# Patient Record
Sex: Male | Born: 1965 | Hispanic: No | Marital: Single | Smoking: Never smoker
Health system: Southern US, Community
[De-identification: ages and names within clinical notes are randomized; demographics above are authoritative.]

---

## 2008-09-14 ENCOUNTER — Emergency Department (HOSPITAL_COMMUNITY): Admission: EM | Admit: 2008-09-14 | Discharge: 2008-09-14 | Payer: Self-pay | Admitting: Emergency Medicine

## 2008-10-08 ENCOUNTER — Encounter: Admission: RE | Admit: 2008-10-08 | Discharge: 2008-10-08 | Payer: Self-pay | Admitting: Family Medicine

## 2010-02-22 IMAGING — CR DG FINGER THUMB 2+V*R*
4 series · 4 of 4 positions shown · non-contrast
Comparison: None

CLINICAL DATA: Pain and swelling

DG RIGHT FINGER THUMB 2+ VIEW

[x finger pa right (1 of 2)]
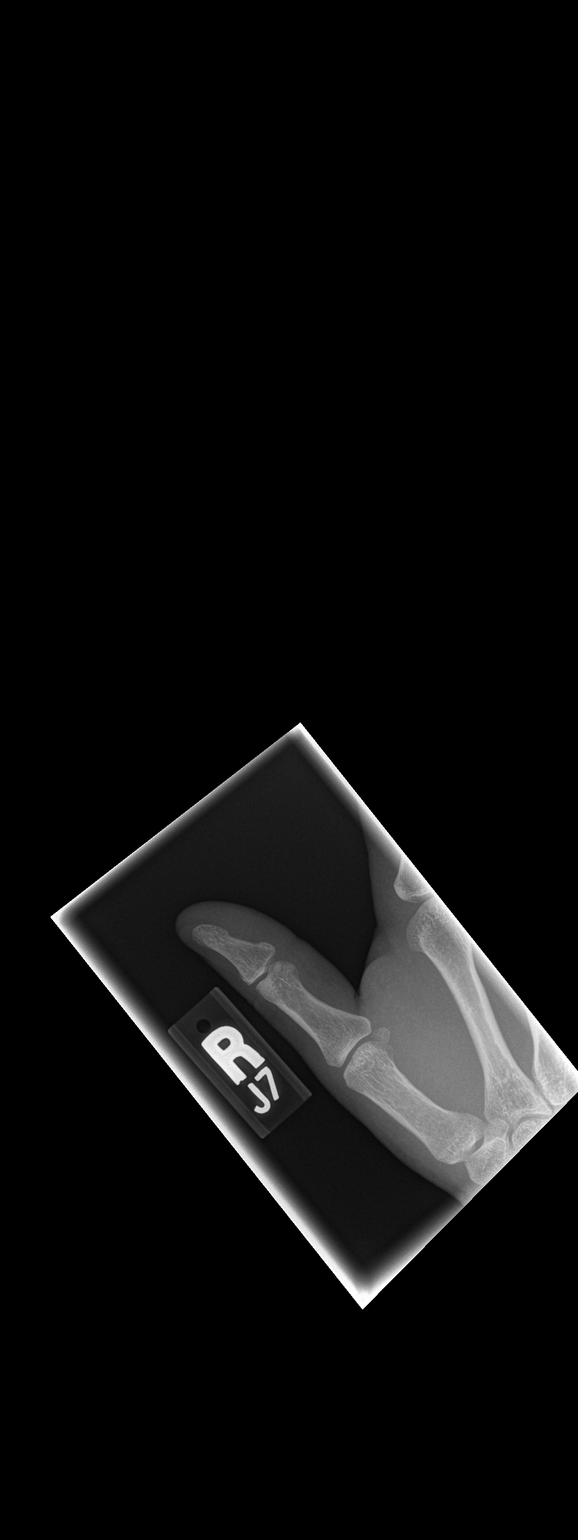

[x finger obl. right]
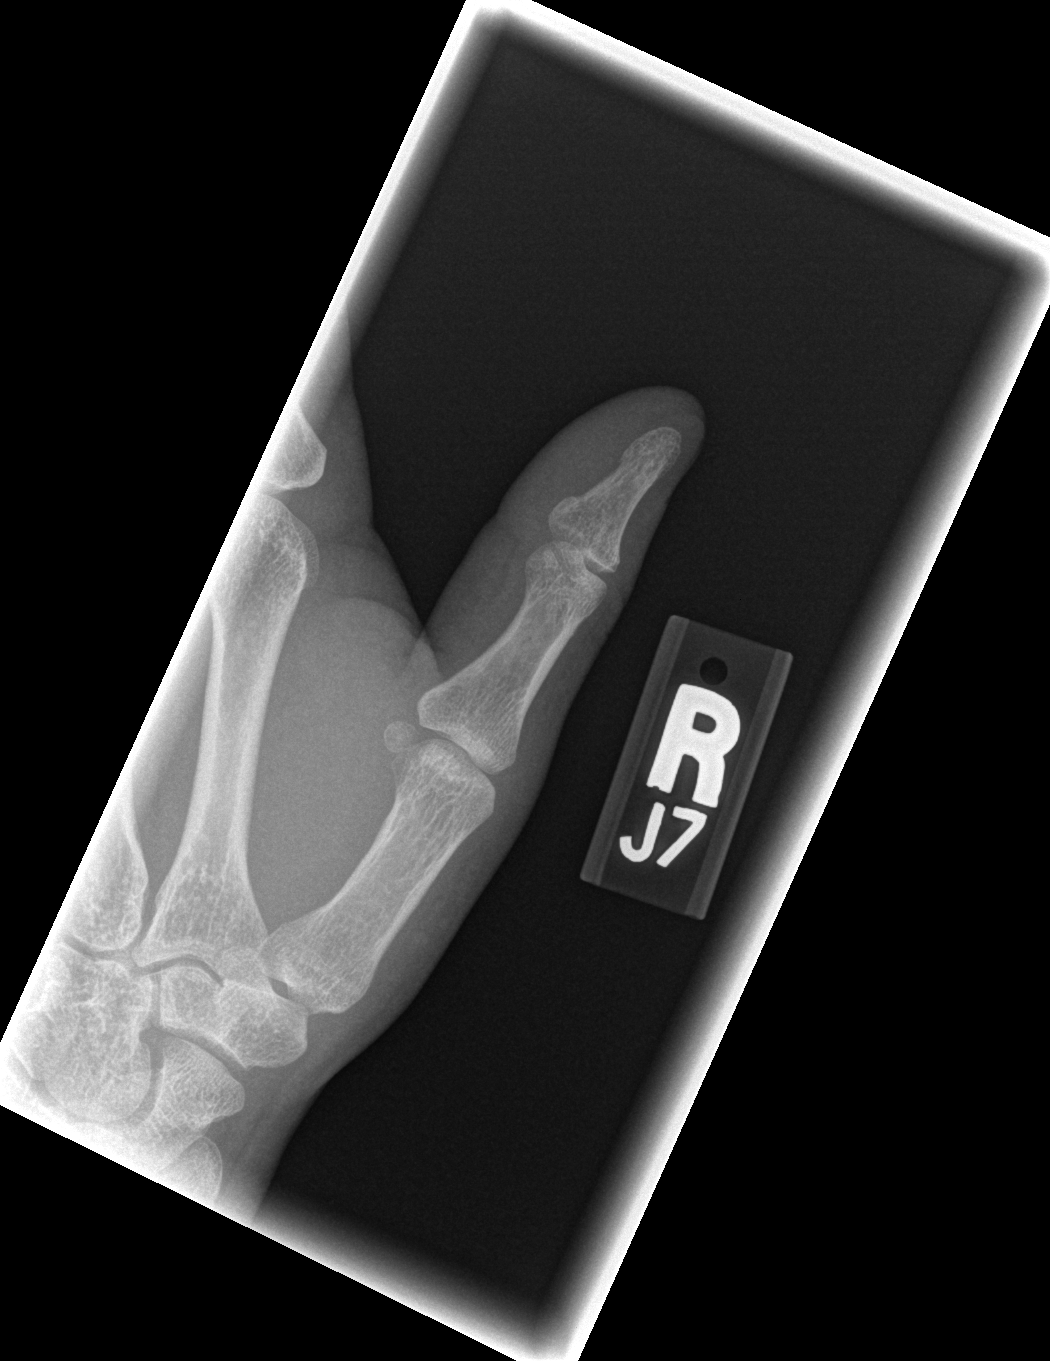

[x finger lateral right]
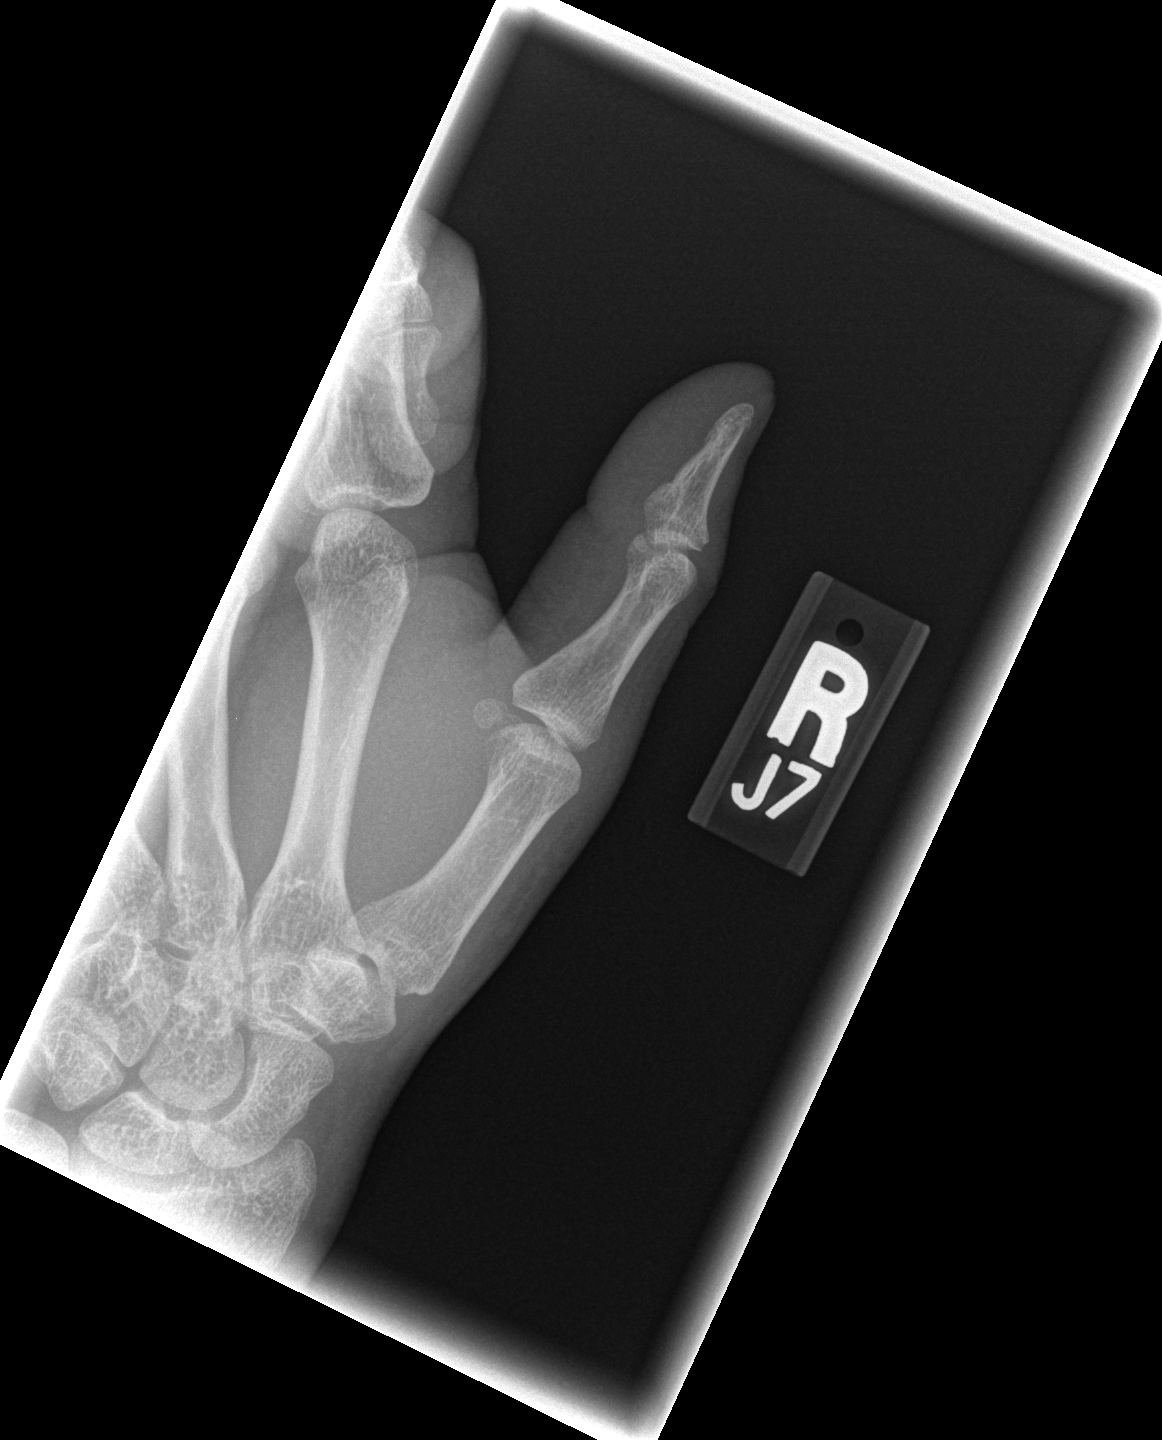

[x finger pa right (2 of 2)]
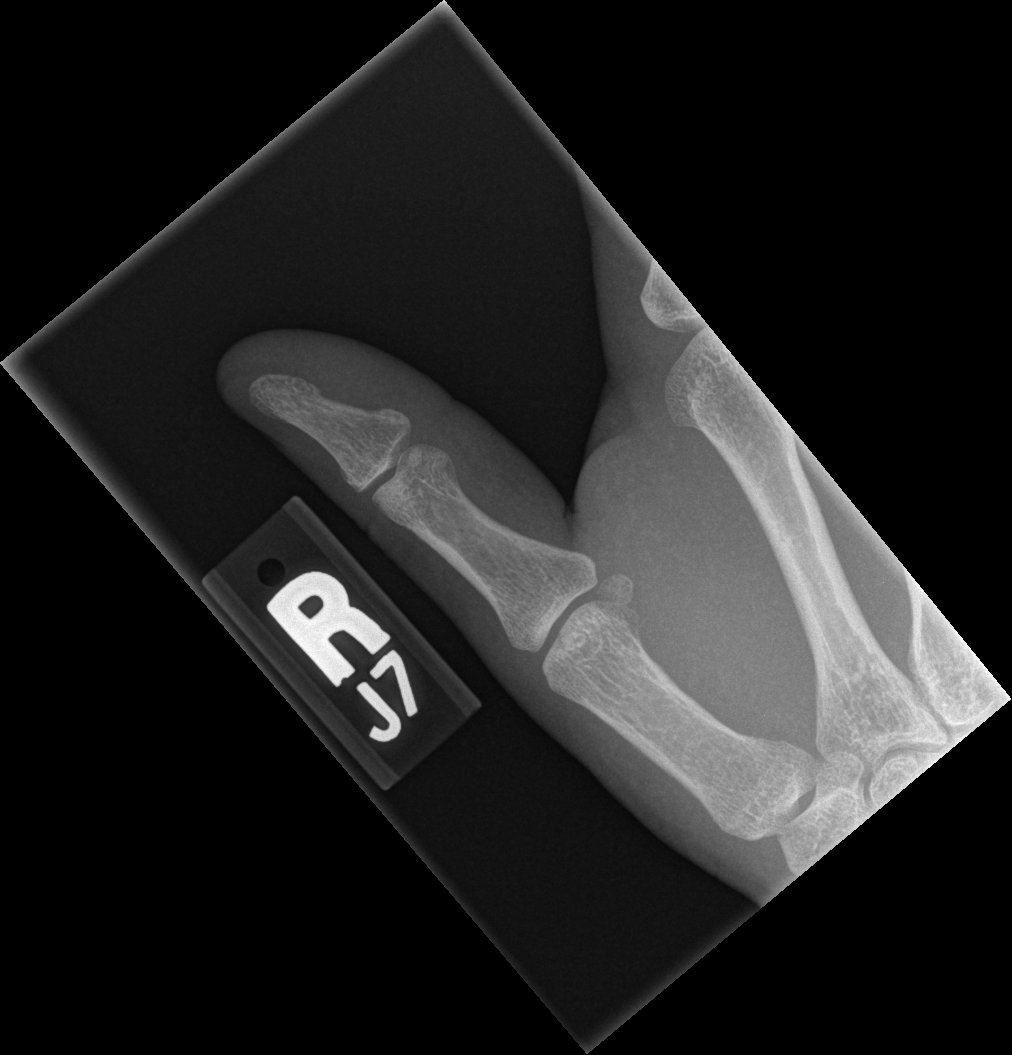

[4 of 4 positions shown; findings below may reference images not displayed]

FINDINGS: No evidence of fracture, dislocation, arthritis or
radiopaque foreign object.
IMPRESSION: Negative radiographs

## 2010-03-18 IMAGING — US US ABDOMEN COMPLETE
1 series · 14 of 25 positions shown · non-contrast
Comparison: None

CLINICAL DATA: Right lower quadrant pain

ABDOMEN ULTRASOUND
TECHNIQUE: Complete abdominal ultrasound examination was performed
including evaluation of the liver, gallbladder, bile ducts,
pancreas, kidneys, spleen, IVC, and abdominal aorta.

[Series 1: us abdomen complete · 0.37mm/px · 14 of 66 slices shown]
[im 1/66]
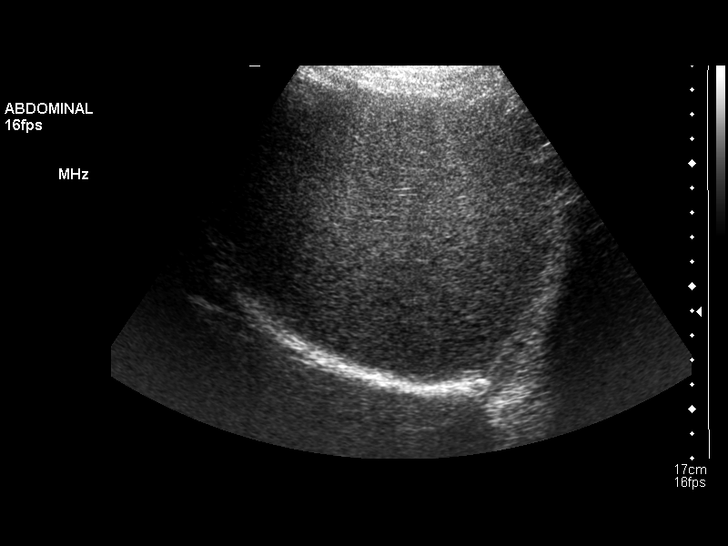
[im 6/66]
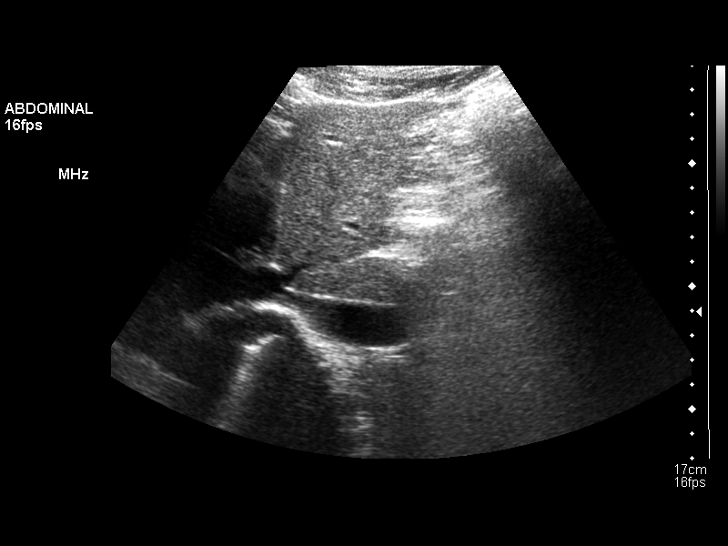
[im 11/66]
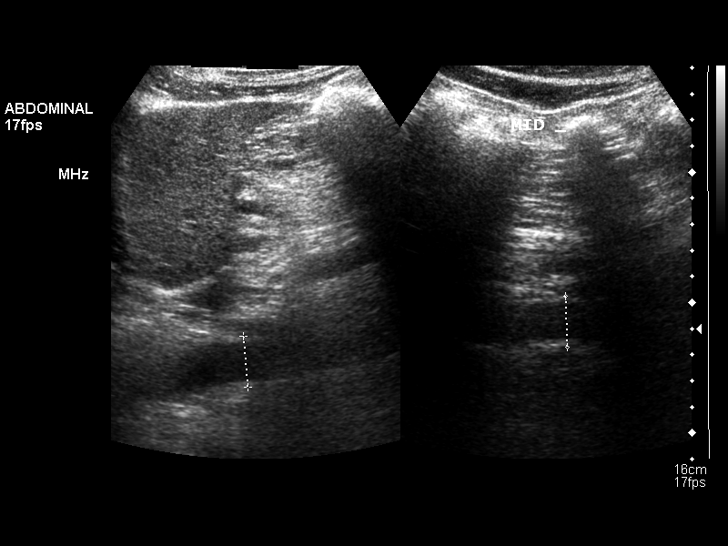
[im 17/66]
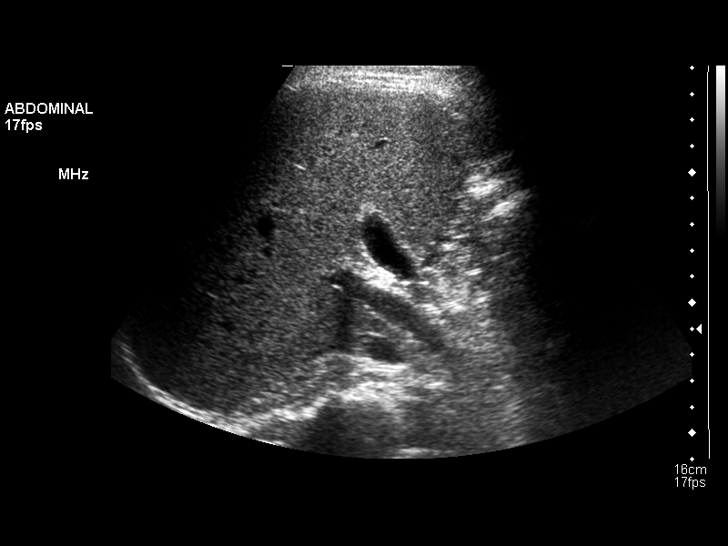
[im 22/66]
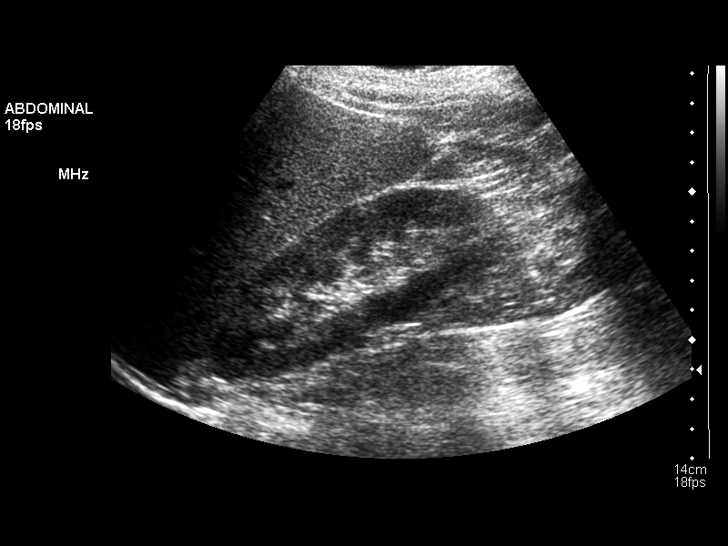
[im 25/66]
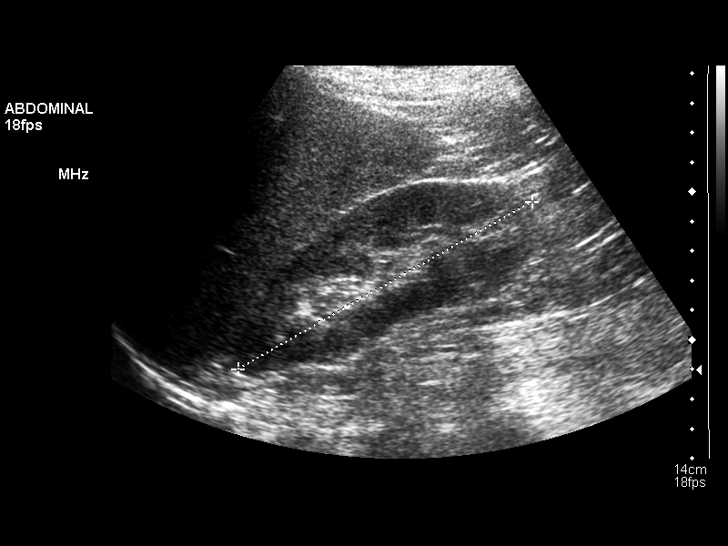
[im 30/66]
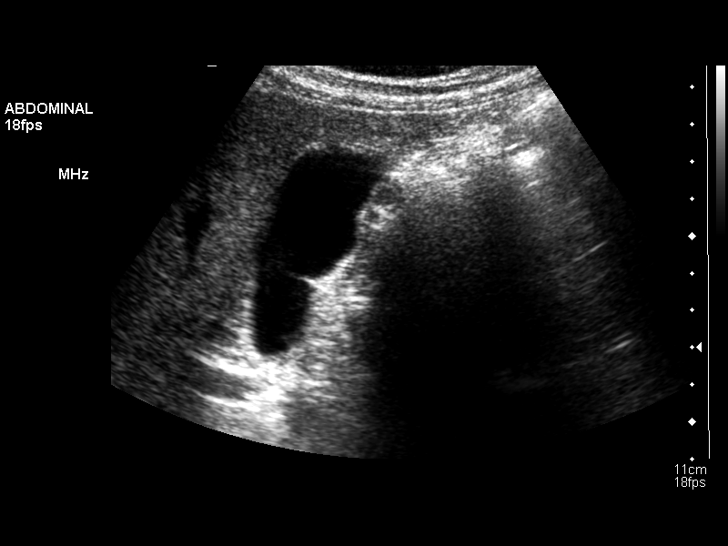
[im 36/66]
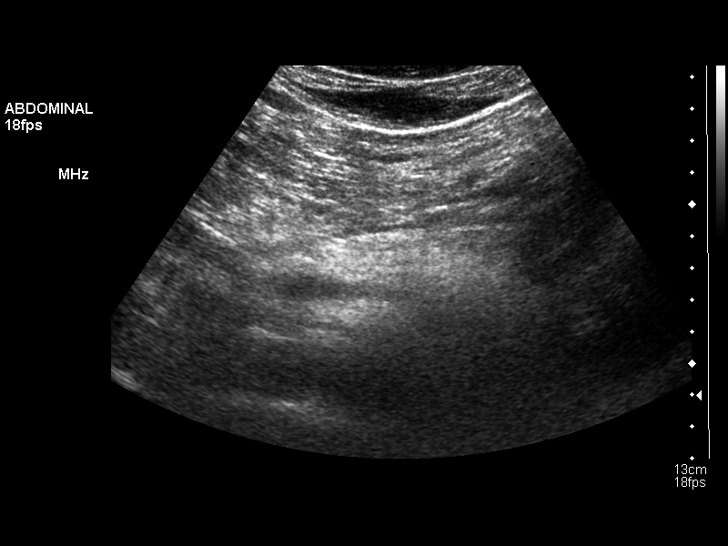
[im 41/66]
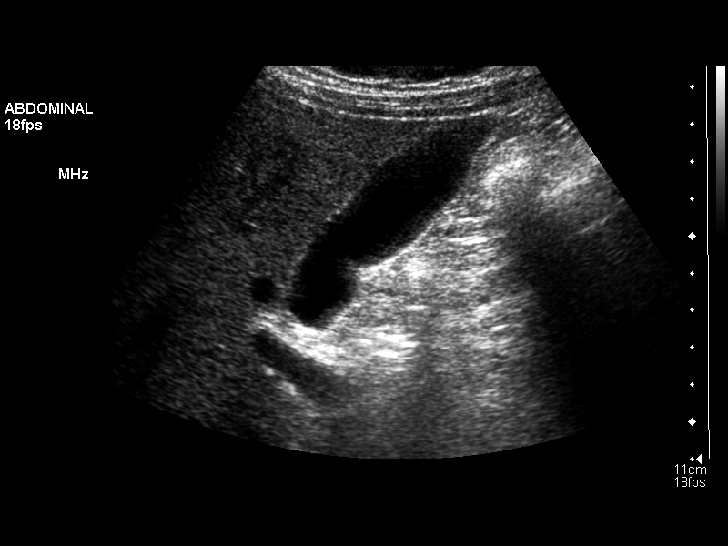
[im 44/66]
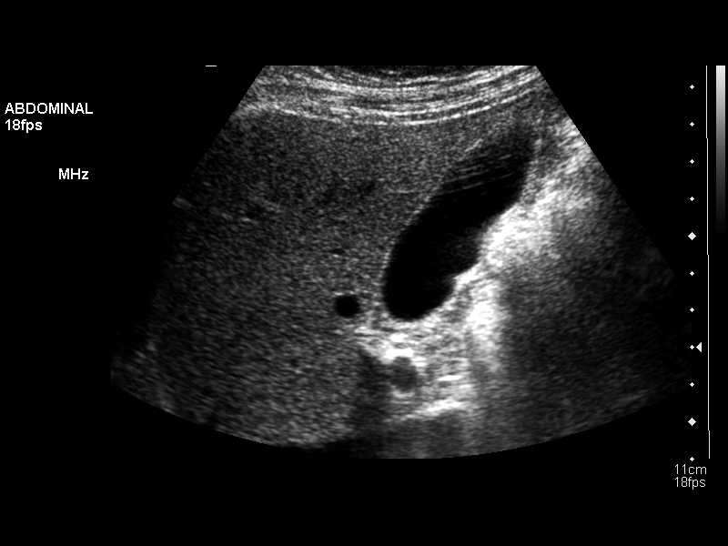
[im 49/66]
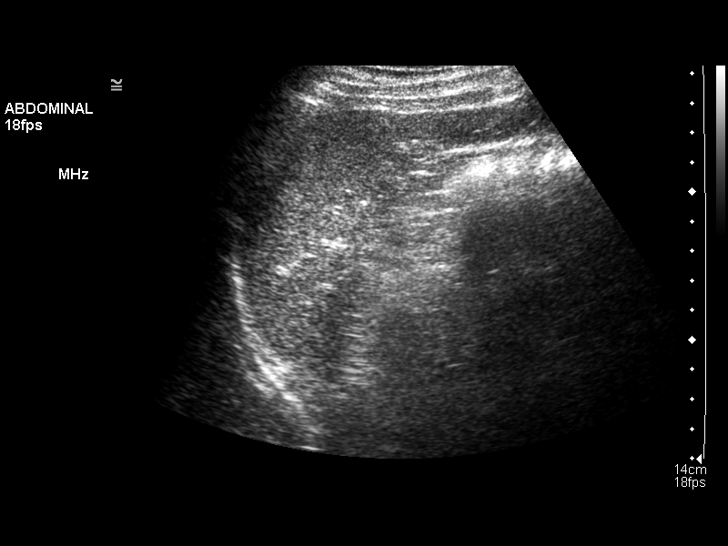
[im 55/66]
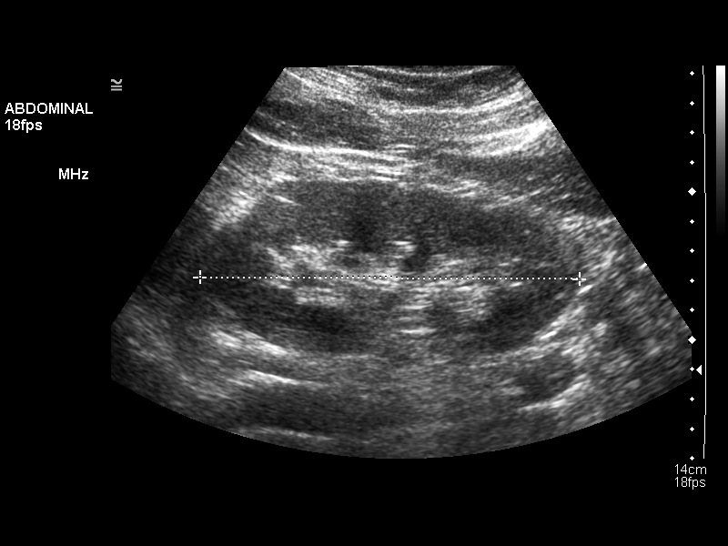
[im 60/66]
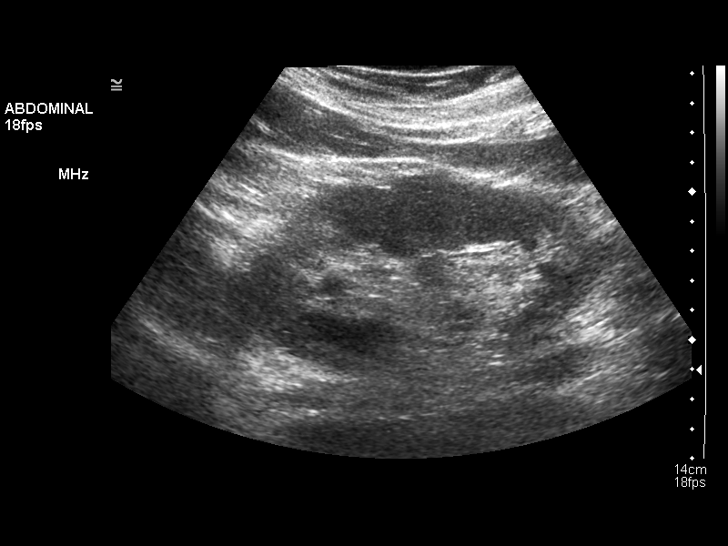
[im 66/66]
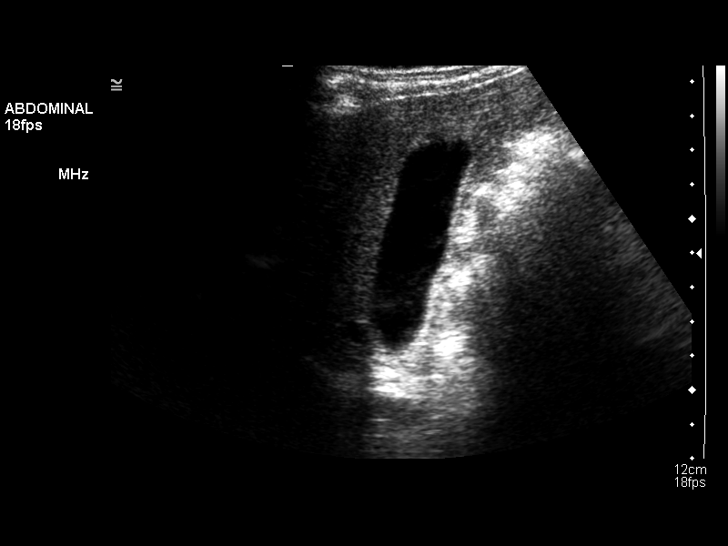

[14 of 25 positions shown; findings below may reference images not displayed]

FINDINGS: Normal gallbladder.  Gallbladder wall thickness is 2 mm.
No biliary ductal dilatation.  Common duct measures 1.7 mm in
diameter.  No hepatic, splenic, pancreatic, or renal abnormality.
Spleen measures 3.9 cm in length.  The right and left kidneys
measure 11.6 cm and 12.8 cm in length, respectively.  Patent IVC.
Maximal diameter abdominal aorta is 2.0 cm.
IMPRESSION: Negative abdominal ultrasound.

REF:G3 DICTATED: 10/08/2008 [DATE]

## 2017-07-11 ENCOUNTER — Ambulatory Visit (INDEPENDENT_AMBULATORY_CARE_PROVIDER_SITE_OTHER): Payer: Self-pay | Admitting: Physician Assistant

## 2017-07-11 ENCOUNTER — Encounter: Payer: Self-pay | Admitting: Physician Assistant

## 2017-07-11 VITALS — BP 120/78 | HR 67 | Temp 97.9°F | Resp 18 | Ht 65.0 in | Wt 159.2 lb

## 2017-07-11 DIAGNOSIS — Z024 Encounter for examination for driving license: Secondary | ICD-10-CM

## 2017-07-11 NOTE — Progress Notes (Signed)
[facility] [address], [place] [id number] [phone]  Date:  07/11/2017   Name:  Guy Wise   DOB:  04/21/1966   MRN:  [id number]  PCP:  Patient, No Pcp Per    History of Present Illness:  Guy Wise is a 51 y.o. male patient who presents to PCP with  Chief Complaint  Patient presents with  . Annual Exam    DOT PE       There are no active problems to display for this patient.   History reviewed. No pertinent past medical history.  History reviewed. No pertinent surgical history.  Social History   Tobacco Use  . Smoking status: Never Smoker  . Smokeless tobacco: Never Used  Substance Use Topics  . Alcohol use: Not on file  . Drug use: Not on file    History reviewed. No pertinent family history.  No Known Allergies  Medication list has been reviewed and updated.  No current outpatient medications on file prior to visit.   No current facility-administered medications on file prior to visit.     Review of Systems  Constitutional: Negative for chills and fever.  HENT: Negative for ear discharge, ear pain and sore throat.   Eyes: Negative for blurred vision and double vision.  Respiratory: Negative for cough, shortness of breath and wheezing.   Cardiovascular: Negative for chest pain, palpitations and leg swelling.  Gastrointestinal: Negative for diarrhea, nausea and vomiting.  Genitourinary: Negative for dysuria, frequency and hematuria.  Skin: Negative for itching and rash.  Neurological: Negative for dizziness and headaches.   ROS otherwise unremarkable unless listed above.  Physical Examination: BP 120/78 (BP Location: Left Arm, Patient Position: Sitting, Cuff Size: Normal)   Pulse 67   Temp 97.9 F (36.6 C) (Oral)   Resp 18   Ht 5\' 5"  (1.651 m)   Wt 159 lb 3.2 oz (72.2 kg)   SpO2 99%   BMI 26.49 kg/m  Ideal Body Weight: Weight in (lb) to have BMI = 25: 149.9  Physical Exam  Constitutional: He is oriented to person,  place, and time. He appears well-developed and well-nourished. No distress.  HENT:  Head: Normocephalic and atraumatic.  Right Ear: Tympanic membrane, external ear and ear canal normal.  Left Ear: Tympanic membrane, external ear and ear canal normal.  Eyes: Conjunctivae and EOM are normal. Pupils are equal, round, and reactive to light.  Cardiovascular: Normal rate and regular rhythm. Exam reveals no friction rub.  No murmur heard. Pulmonary/Chest: Effort normal. No respiratory distress. He has no wheezes.  Abdominal: Soft. Bowel sounds are normal. He exhibits no distension and no mass. There is no tenderness.  Musculoskeletal: Normal range of motion. He exhibits no edema or tenderness.  Neurological: He is alert and oriented to person, place, and time. He displays normal reflexes.  Skin: Skin is warm and dry. He is not diaphoretic.  Psychiatric: He has a normal mood and affect. His behavior is normal.    Visual Acuity Screening   Right eye Left eye Both eyes  Without correction: 20/25 20/20 20/20   With correction:     Comments: Pt could recognize and distinguish colors.  Horizontal Field of Vision: Right eye: 85 degrees, Left eye: 85 degrees  Hearing Screening Comments: Pt could hear whispered colors for whisper test      Assessment and Plan: Guy Wise is a 51 y.o. 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

## 2017-07-11 NOTE — Patient Instructions (Signed)
     IF you received an x-ray today, you will receive an invoice from [facility]. Please contact [facility] at [phone] with questions or concerns regarding your invoice.   IF you received labwork today, you will receive an invoice from [facility]. Please contact [facility] at [phone] with questions or concerns regarding your invoice.   Our billing staff will not be able to assist you with questions regarding bills from these companies.  You will be contacted with the lab results as soon as they are available. The fastest way to get your results is to activate your My Chart account. Instructions are located on the last page of this paperwork. If you have not heard from us regarding the results in 2 weeks, please contact this office.     

## 2017-12-12 ENCOUNTER — Encounter: Payer: Self-pay | Admitting: Physician Assistant
